# Patient Record
Sex: Male | Born: 1980 | Race: Black or African American | Hispanic: No
Health system: Southern US, Community
[De-identification: ages and names within clinical notes are randomized; demographics above are authoritative.]

---

## 2015-05-19 ENCOUNTER — Encounter: Payer: Self-pay | Admitting: Radiology

## 2015-05-19 ENCOUNTER — Emergency Department: Payer: Self-pay

## 2015-05-19 ENCOUNTER — Emergency Department
Admission: EM | Admit: 2015-05-19 | Discharge: 2015-05-19 | Disposition: A | Discharge status: Home / Self Care | Payer: Self-pay | Attending: Emergency Medicine | Admitting: Emergency Medicine

## 2015-05-19 DIAGNOSIS — K088 Other specified disorders of teeth and supporting structures: Secondary | ICD-10-CM | POA: Insufficient documentation

## 2015-05-19 DIAGNOSIS — K0889 Other specified disorders of teeth and supporting structures: Secondary | ICD-10-CM

## 2015-05-19 DIAGNOSIS — R1084 Generalized abdominal pain: Secondary | ICD-10-CM | POA: Insufficient documentation

## 2015-05-19 DIAGNOSIS — R109 Unspecified abdominal pain: Secondary | ICD-10-CM

## 2015-05-19 LAB — CBC
HEMATOCRIT: 46.3 % (ref 40.0–52.0)
HEMOGLOBIN: 15.4 g/dL (ref 13.0–18.0)
MCH: 31.2 pg (ref 26.0–34.0)
MCHC: 33.4 g/dL (ref 32.0–36.0)
MCV: 93.5 fL (ref 80.0–100.0)
Platelets: 217 10*3/uL (ref 150–440)
RBC: 4.95 MIL/uL (ref 4.40–5.90)
RDW: 13 % (ref 11.5–14.5)
WBC: 12.2 10*3/uL — AB (ref 3.8–10.6)

## 2015-05-19 LAB — COMPREHENSIVE METABOLIC PANEL
ALBUMIN: 3.9 g/dL (ref 3.5–5.0)
ALT: 14 U/L — AB (ref 17–63)
AST: 17 U/L (ref 15–41)
Alkaline Phosphatase: 63 U/L (ref 38–126)
Anion gap: 9 (ref 5–15)
BILIRUBIN TOTAL: 0.8 mg/dL (ref 0.3–1.2)
BUN: 13 mg/dL (ref 6–20)
CALCIUM: 8.9 mg/dL (ref 8.9–10.3)
CO2: 25 mmol/L (ref 22–32)
CREATININE: 1.37 mg/dL — AB (ref 0.61–1.24)
Chloride: 106 mmol/L (ref 101–111)
GFR calc non Af Amer: >60 mL/min (ref >60)
Glucose, Bld: 94 mg/dL (ref 65–99)
Potassium: 4.5 mmol/L (ref 3.5–5.1)
Sodium: 140 mmol/L (ref 135–145)
Total Protein: 7.1 g/dL (ref 6.5–8.1)

## 2015-05-19 LAB — LIPASE, BLOOD: Lipase: 41 U/L (ref 22–51)

## 2015-05-19 MED ORDER — MORPHINE SULFATE 2 MG/ML IJ SOLN
2.0000 mg | Freq: Once | INTRAMUSCULAR | Status: AC
  Administered 2015-05-19: 2 mg via INTRAVENOUS

## 2015-05-19 MED ORDER — PENICILLIN V POTASSIUM 250 MG PO TABS: 250.0000 mg | ORAL_TABLET | Freq: Four times a day (QID) | ORAL | Status: AC

## 2015-05-19 MED ORDER — IOHEXOL 300 MG/ML  SOLN
100.0000 mL | Freq: Once | INTRAMUSCULAR | Status: AC | PRN
  Administered 2015-05-19: 100 mL via INTRAVENOUS

## 2015-05-19 MED ORDER — MORPHINE SULFATE 2 MG/ML IJ SOLN
INTRAMUSCULAR | Status: AC
  Filled 2015-05-19: qty 1

## 2015-05-19 MED ORDER — DIPHENHYDRAMINE HCL 50 MG/ML IJ SOLN
25.0000 mg | Freq: Once | INTRAMUSCULAR | Status: AC
  Administered 2015-05-19: 25 mg via INTRAVENOUS

## 2015-05-19 MED ORDER — IOHEXOL 240 MG/ML SOLN
25.0000 mL | Freq: Once | INTRAMUSCULAR | Status: AC | PRN
  Administered 2015-05-19: 25 mL via ORAL

## 2015-05-19 MED ORDER — ONDANSETRON HCL 4 MG/2ML IJ SOLN
INTRAMUSCULAR | Status: AC
  Filled 2015-05-19: qty 2

## 2015-05-19 MED ORDER — ONDANSETRON HCL 4 MG/2ML IJ SOLN
4.0000 mg | Freq: Once | INTRAMUSCULAR | Status: AC
  Administered 2015-05-19: 4 mg via INTRAVENOUS

## 2015-05-19 MED ORDER — DIPHENHYDRAMINE HCL 50 MG/ML IJ SOLN
INTRAMUSCULAR | Status: AC
  Administered 2015-05-19: 25 mg via INTRAVENOUS
  Filled 2015-05-19: qty 1

## 2015-05-19 NOTE — ED Provider Notes (Signed)
[facility] Emergency Department Provider Note  ____________________________________________  Time seen: 4:30am  I have reviewed the triage vital signs and the nursing notes.   HISTORY  Chief Complaint Abdominal Pain      HPI Jacob Powers is a 34 y.o. male resents would not out of 10 sharp mid abdominal pain 3 months with acute worsening the last 2 days. Patient denies nausea no vomiting no diarrhea or constipation. In addition the patient also admits to left lower toothache with possible abscess.    No past medical history on file.  There are no active problems to display for this patient.   No past surgical history on file.  No current outpatient prescriptions on file.  Allergies Morphine and related  No family history on file.  Social History History  Substance Use Topics  . Smoking status: Not on file  . Smokeless tobacco: Not on file  . Alcohol Use: Not on file    Review of Systems  Constitutional: Negative for fever. Eyes: Negative for visual changes. ENT: Negative for sore throat. Cardiovascular: Negative for chest pain. Respiratory: Negative for shortness of breath. Gastrointestinal: Negative for abdominal pain, vomiting and diarrhea. Genitourinary: Negative for dysuria. Musculoskeletal: Negative for back pain. Skin: Negative for rash. Neurological: Negative for headaches, focal weakness or numbness.  10-point ROS otherwise negative.  ____________________________________________   PHYSICAL EXAM:  VITAL SIGNS: ED Triage Vitals  Enc Vitals Group     BP 05/19/15 0317 155/103 mmHg     Pulse Rate 05/19/15 0317 71     Resp 05/19/15 0317 18     Temp 05/19/15 0317 99.1 F (37.3 C)     Temp Source 05/19/15 0317 Oral     SpO2 05/19/15 0317 99 %     Weight 05/19/15 0317 130 lb (58.968 kg)     Height 05/19/15 0317 5\' 5"  (1.651 m)     Head Cir --      Peak Flow --      Pain Score 05/19/15 0318 10     Pain Loc --     Pain Edu? --      Excl. in GC? --     Constitutional: Alert and oriented. Well appearing and in no distress. Eyes: Conjunctivae are normal. PERRL. Normal extraocular movements. ENT   Head: Normocephalic and atraumatic.   Nose: No congestion/rhinnorhea.   Mouth/Throat: Mucous membranes are moist.   Neck: No stridor. Hematological/Lymphatic/Immunilogical: No cervical lymphadenopathy. Cardiovascular: Normal rate, regular rhythm. Normal and symmetric distal pulses are present in all extremities. No murmurs, rubs, or gallops. Respiratory: Normal respiratory effort without tachypnea nor retractions. Breath sounds are clear and equal bilaterally. No wheezes/rales/rhonchi. Gastrointestinal: Generalized abdominal pain with palpation. No distention. There is no CVA tenderness. Genitourinary: deferred Musculoskeletal: Nontender with normal range of motion in all extremities. No joint effusions.  No lower extremity tenderness nor edema. Neurologic:  Normal speech and language. No gross focal neurologic deficits are appreciated. Speech is normal.  Skin:  Skin is warm, dry and intact. No rash noted. Psychiatric: Mood and affect are normal. Speech and behavior are normal. Patient exhibits appropriate insight and judgment.  ____________________________________________    LABS (pertinent positives/negatives)  Labs Reviewed  CBC - Abnormal; Notable for the following:    WBC 12.2 (*)    All other components within normal limits  COMPREHENSIVE METABOLIC PANEL - Abnormal; Notable for the following:    Creatinine, Ser 1.37 (*)    ALT 14 (*)    All other components  within normal limits  LIPASE, BLOOD     ____________________________________________       RADIOLOGY  CT abdomen and pelvis pending  ____________________________________________    INITIAL IMPRESSION / ASSESSMENT AND PLAN / ED COURSE  Pertinent labs & imaging results that were available during my care of the  patient were reviewed by me and considered in my medical decision making (see chart for details).  Care transferred to Dr. Cyril LoosenKinner  ____________________________________________   FINAL CLINICAL IMPRESSION(S) / ED DIAGNOSES  Final diagnoses:  Abdominal pain, unspecified abdominal location  Pain, dental      Darci Currentandolph N Brown, MD 05/24/15 [phone]

## 2015-05-19 NOTE — Discharge Instructions (Signed)
Abdominal Pain Many things can cause abdominal pain. Usually, abdominal pain is not caused by a disease and will improve without treatment. It can often be observed and treated at home. Your health care provider will do a physical exam and possibly order blood tests and X-rays to help determine the seriousness of your pain. However, in many cases, more time must pass before a clear cause of the pain can be found. Before that point, your health care provider may not know if you need more testing or further treatment. HOME CARE INSTRUCTIONS  Monitor your abdominal pain for any changes. The following actions may help to alleviate any discomfort you are experiencing:  Only take over-the-counter or prescription medicines as directed by your health care provider.  Do not take laxatives unless directed to do so by your health care provider.  Try a clear liquid diet (broth, tea, or water) as directed by your health care provider. Slowly move to a bland diet as tolerated. SEEK MEDICAL CARE IF:  You have unexplained abdominal pain.  You have abdominal pain associated with nausea or diarrhea.  You have pain when you urinate or have a bowel movement.  You experience abdominal pain that wakes you in the night.  You have abdominal pain that is worsened or improved by eating food.  You have abdominal pain that is worsened with eating fatty foods.  You have a fever. SEEK IMMEDIATE MEDICAL CARE IF:   Your pain does not go away within 2 hours.  You keep throwing up (vomiting).  Your pain is felt only in portions of the abdomen, such as the right side or the left lower portion of the abdomen.  You pass bloody or black tarry stools. MAKE SURE YOU:  Understand these instructions.   Will watch your condition.   Will get help right away if you are not doing well or get worse.  Document Released: 09/25/2005 Document Revised: 12/21/2013 Document Reviewed: 08/25/2013 [facility] Patient Information  2015 [employer], [place]. This information is not intended to replace advice given to you by your health care provider. Make sure you discuss any questions you have with your health care provider.  Dental Pain A tooth ache may be caused by cavities (tooth decay). Cavities expose the nerve of the tooth to air and hot or cold temperatures. It may come from an infection or abscess (also called a boil or furuncle) around your tooth. It is also often caused by dental caries (tooth decay). This causes the pain you are having. DIAGNOSIS  Your caregiver can diagnose this problem by exam. TREATMENT   If caused by an infection, it may be treated with medications which kill germs (antibiotics) and pain medications as prescribed by your caregiver. Take medications as directed.  Only take over-the-counter or prescription medicines for pain, discomfort, or fever as directed by your caregiver.  Whether the tooth ache today is caused by infection or dental disease, you should see your dentist as soon as possible for further care. SEEK MEDICAL CARE IF: The exam and treatment you received today has been provided on an emergency basis only. This is not a substitute for complete medical or dental care. If your problem worsens or new problems (symptoms) appear, and you are unable to meet with your dentist, call or return to this location. SEEK IMMEDIATE MEDICAL CARE IF:   You have a fever.  You develop redness and swelling of your face, jaw, or neck.  You are unable to open your mouth.  You  have severe pain uncontrolled by pain medicine. MAKE SURE YOU:   Understand these instructions.  Will watch your condition.  Will get help right away if you are not doing well or get worse. Document Released: 12/16/2005 Document Revised: 03/09/2012 Document Reviewed: 08/03/2008 [facility] Patient Information 2015 [place], [place]. This information is not intended to replace advice given to you by your health care provider.  Make sure you discuss any questions you have with your health care provider.

## 2015-05-19 NOTE — ED Notes (Signed)
Pt reports has c/o sharp mid abd pain intermittently for the past 3 months. Non-tender.  Also has developed left lower tooth pain with possible abscess.

## 2015-05-19 NOTE — ED Provider Notes (Signed)
Signed out to me by Dr. Manson PasseyBrown with instructions to follow-up CT. CT results reviewed, no acute abnormalities. Patient feeling well. We'll discharge with follow-up with PCP.  Jene Everyobert Nivek Powley, MD 05/19/15 [phone]

## 2015-05-19 NOTE — ED Notes (Signed)
Pt in with co abd pain x 3 month and also has a toothache

## 2016-11-28 IMAGING — CT CT ABD-PELV W/ CM
1 of 2 series · 15 of 32 positions shown, 19 images · IV contrast (omnipaque)
Comparison: None.

CLINICAL DATA: Mid abdominal pain for 3 months

EXAM:
CT ABDOMEN AND PELVIS WITH CONTRAST
TECHNIQUE: Multidetector CT imaging of the abdomen and pelvis was performed
using the standard protocol following bolus administration of
intravenous contrast.
CONTRAST:  100mL OMNIPAQUE IOHEXOL 300 MG/ML  SOLN

[Series 2: routine abd pel with · axial · 0.59mm/px · z∈[-1104,-724]mm · 15 of 84 slices shown, 19 images]
[im 4/84  soft-tissue]
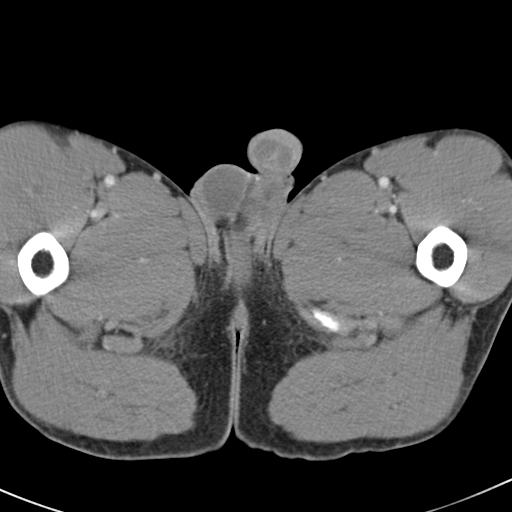
[im 4/84  bone]
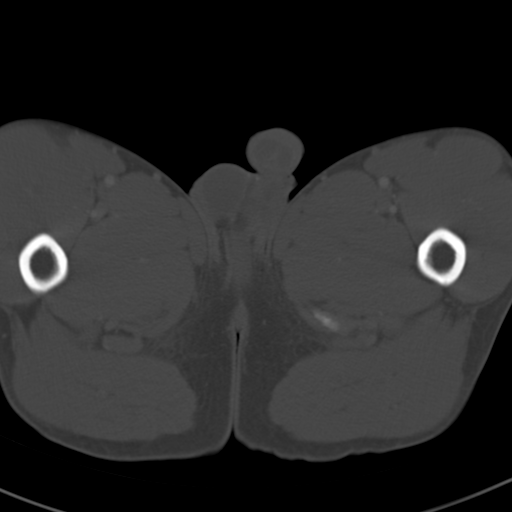
[im 10/84  soft-tissue]
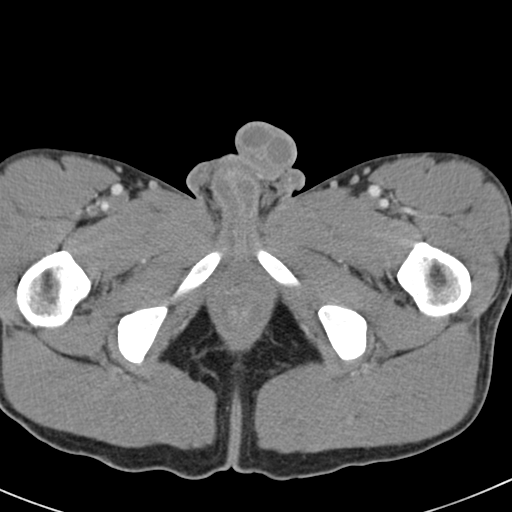
[im 17/84  soft-tissue]
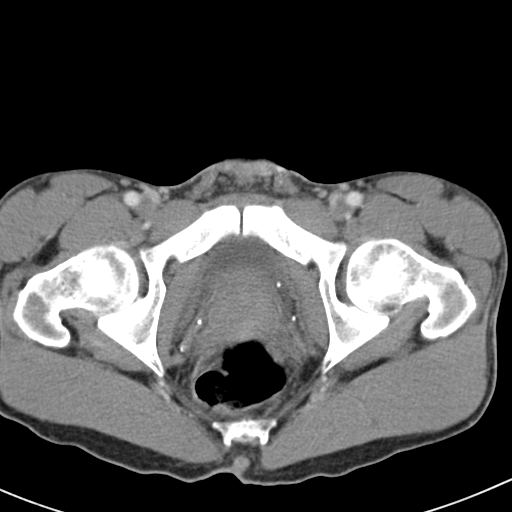
[im 24/84  soft-tissue]
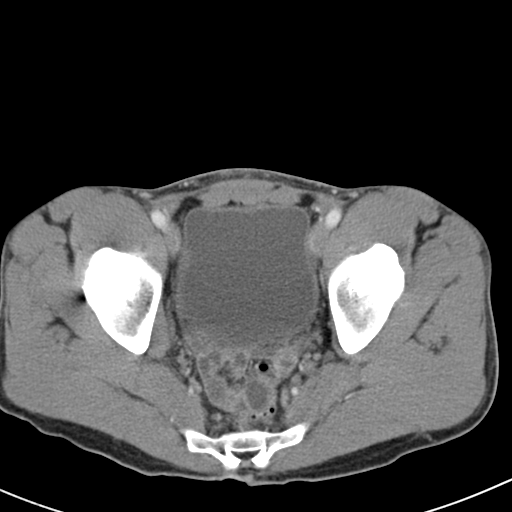
[im 30/84  soft-tissue]
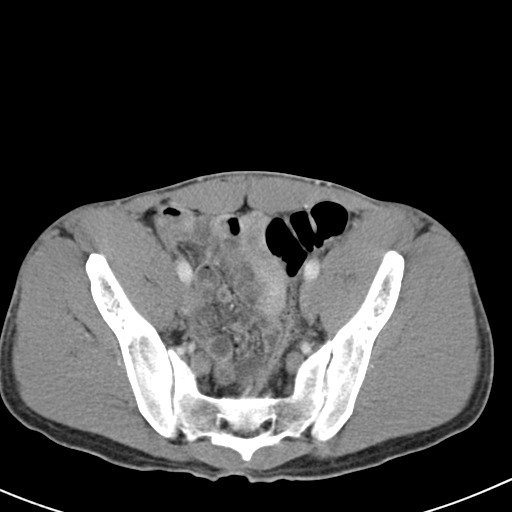
[im 37/84  soft-tissue]
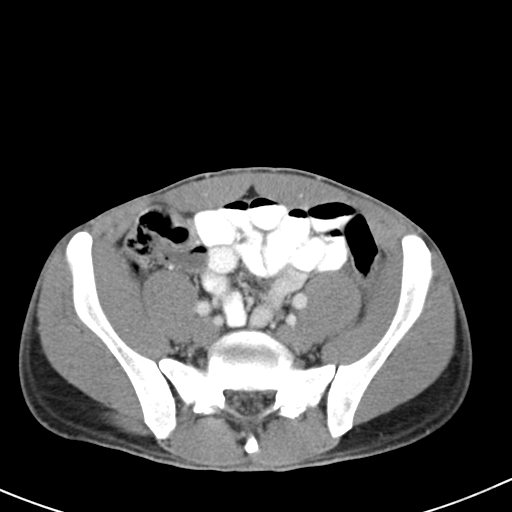
[im 44/84  soft-tissue]
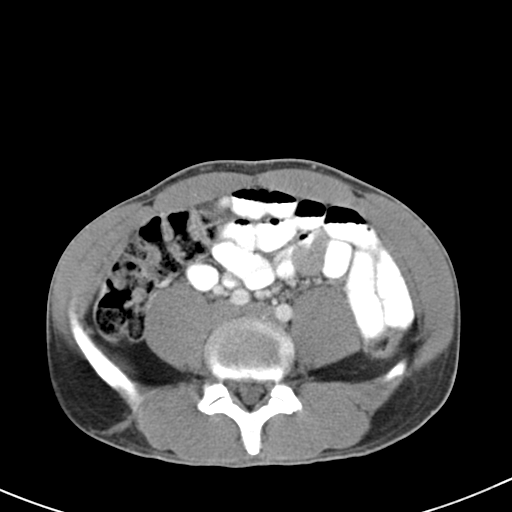
[im 47/84  soft-tissue]
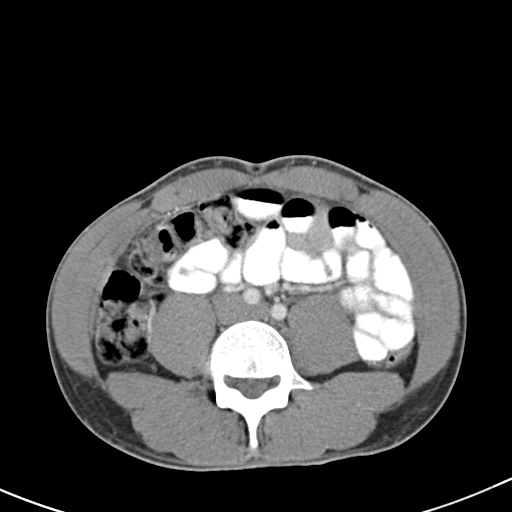
[im 54/84  soft-tissue]
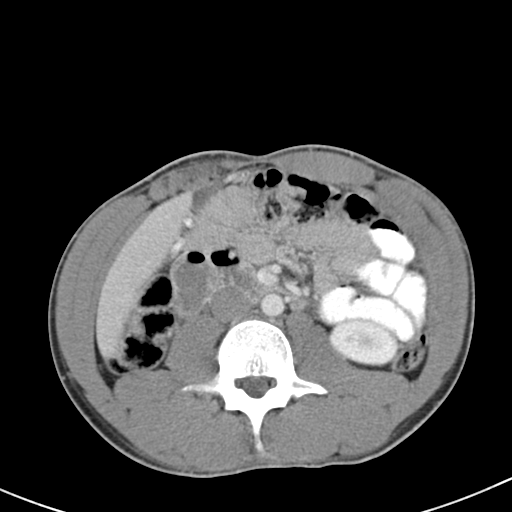
[im 54/84  bone]
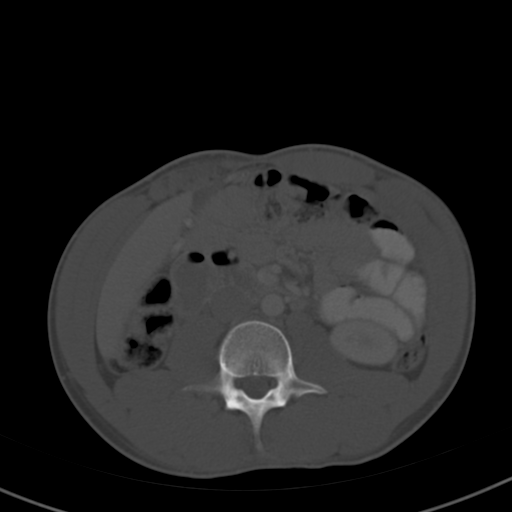
[im 60/84  soft-tissue]
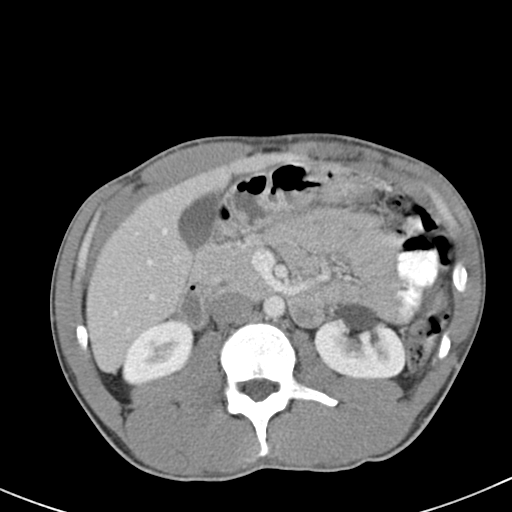
[im 67/84  soft-tissue]
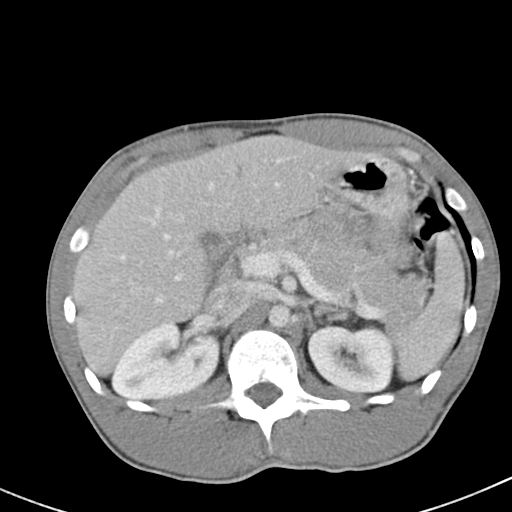
[im 70/84  lung]
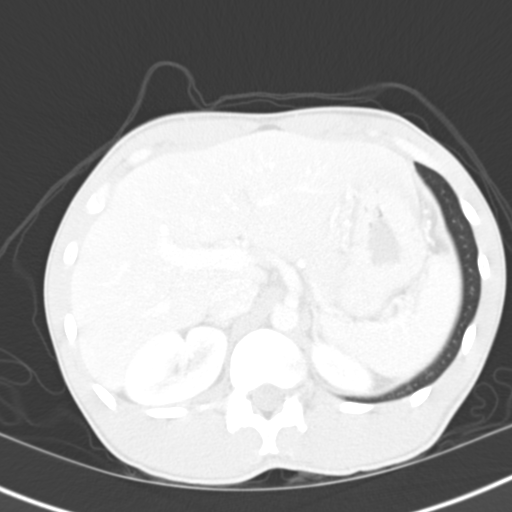
[im 74/84  soft-tissue]
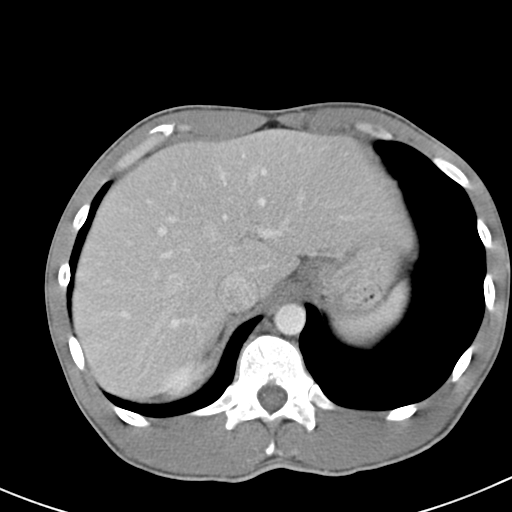
[im 74/84  lung]
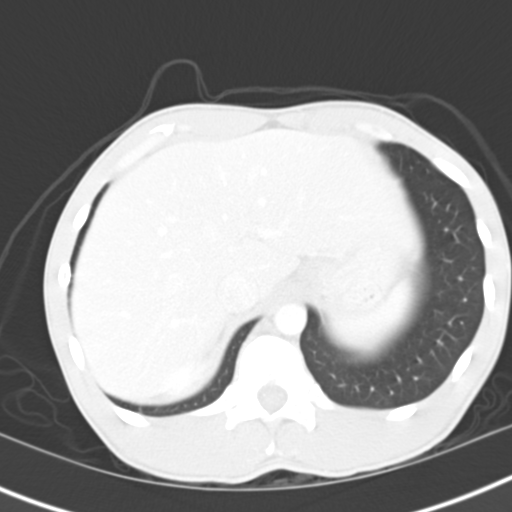
[im 77/84  lung]
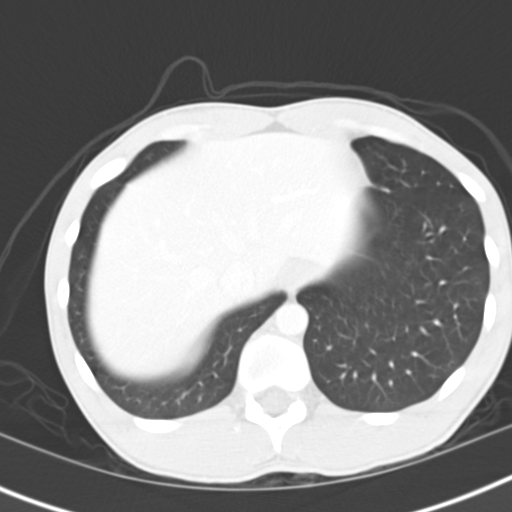
[im 80/84  soft-tissue]
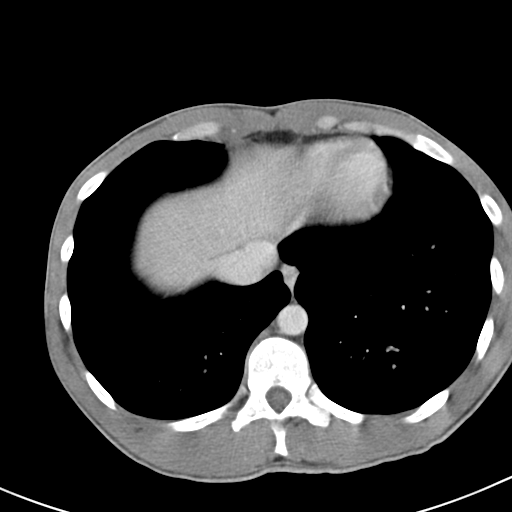
[im 80/84  lung]
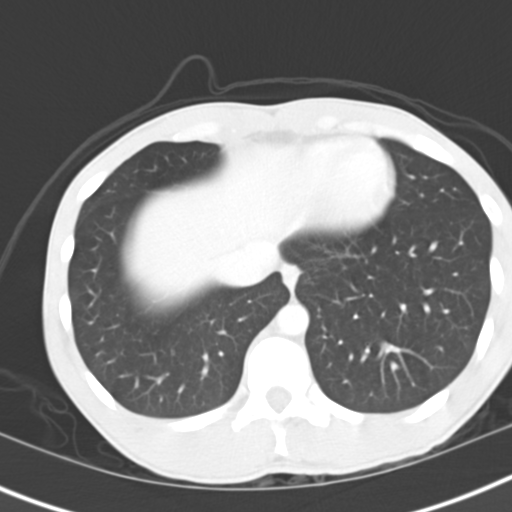

[15 of 32 positions shown; findings below may reference images not displayed]

FINDINGS: Lung bases are free of acute infiltrate or sizable effusion. The
liver, gallbladder, spleen, adrenal glands and pancreas are within
normal limits with the exception of a few tiny hypodensities within
the dome of the liver likely representing small cysts. These are too
small for adequate characterization on this exam. The kidneys
demonstrate a normal enhancement pattern bilaterally. No obstructive
changes are seen. No calculi are noted.

The appendix is not discretely visualized although no inflammatory
changes to suggest appendicitis are seen. The bladder is well
distended. No pelvic mass lesion or sidewall abnormality is noted.
No acute bony abnormality is noted.
IMPRESSION: No acute abnormality is seen.
